# Patient Record
Sex: Female | Born: 1950 | Race: White | Hispanic: No | Marital: Married
Health system: Southern US, Community
[De-identification: ages and names within clinical notes are randomized; demographics above are authoritative.]

---

## 1970-08-23 HISTORY — PX: BREAST EXCISIONAL BIOPSY: SUR124

## 1998-01-02 ENCOUNTER — Other Ambulatory Visit: Admission: RE | Admit: 1998-01-02 | Discharge: 1998-01-02 | Payer: Self-pay | Admitting: Obstetrics & Gynecology

## 1999-01-22 ENCOUNTER — Other Ambulatory Visit: Admission: RE | Admit: 1999-01-22 | Discharge: 1999-01-22 | Payer: Self-pay | Admitting: Obstetrics & Gynecology

## 1999-09-15 ENCOUNTER — Other Ambulatory Visit: Admission: RE | Admit: 1999-09-15 | Discharge: 1999-09-15 | Payer: Self-pay | Admitting: Otolaryngology

## 1999-09-15 ENCOUNTER — Encounter (INDEPENDENT_AMBULATORY_CARE_PROVIDER_SITE_OTHER): Payer: Self-pay

## 2000-01-26 ENCOUNTER — Other Ambulatory Visit: Admission: RE | Admit: 2000-01-26 | Discharge: 2000-01-26 | Payer: Self-pay | Admitting: Obstetrics & Gynecology

## 2001-04-26 ENCOUNTER — Other Ambulatory Visit: Admission: RE | Admit: 2001-04-26 | Discharge: 2001-04-26 | Payer: Self-pay | Admitting: Obstetrics & Gynecology

## 2002-05-08 ENCOUNTER — Other Ambulatory Visit: Admission: RE | Admit: 2002-05-08 | Discharge: 2002-05-08 | Payer: Self-pay | Admitting: Obstetrics & Gynecology

## 2002-10-03 ENCOUNTER — Encounter: Admission: RE | Admit: 2002-10-03 | Discharge: 2002-10-03 | Payer: Self-pay | Admitting: Obstetrics & Gynecology

## 2002-10-03 ENCOUNTER — Encounter: Payer: Self-pay | Admitting: Obstetrics & Gynecology

## 2002-10-03 ENCOUNTER — Encounter (INDEPENDENT_AMBULATORY_CARE_PROVIDER_SITE_OTHER): Payer: Self-pay | Admitting: *Deleted

## 2002-11-01 ENCOUNTER — Ambulatory Visit (HOSPITAL_COMMUNITY): Admission: RE | Admit: 2002-11-01 | Discharge: 2002-11-01 | Payer: Self-pay | Admitting: General Surgery

## 2002-11-01 ENCOUNTER — Encounter (INDEPENDENT_AMBULATORY_CARE_PROVIDER_SITE_OTHER): Payer: Self-pay | Admitting: Specialist

## 2003-06-10 ENCOUNTER — Other Ambulatory Visit: Admission: RE | Admit: 2003-06-10 | Discharge: 2003-06-10 | Payer: Self-pay | Admitting: Obstetrics and Gynecology

## 2003-11-27 ENCOUNTER — Encounter: Admission: RE | Admit: 2003-11-27 | Discharge: 2004-01-23 | Payer: Self-pay | Admitting: Family Medicine

## 2003-12-12 ENCOUNTER — Encounter: Admission: RE | Admit: 2003-12-12 | Discharge: 2003-12-12 | Payer: Self-pay | Admitting: Obstetrics and Gynecology

## 2003-12-27 ENCOUNTER — Observation Stay (HOSPITAL_COMMUNITY): Admission: AD | Admit: 2003-12-27 | Discharge: 2003-12-28 | Payer: Self-pay | Admitting: Obstetrics and Gynecology

## 2003-12-27 ENCOUNTER — Encounter (INDEPENDENT_AMBULATORY_CARE_PROVIDER_SITE_OTHER): Payer: Self-pay | Admitting: Specialist

## 2004-06-04 ENCOUNTER — Encounter: Admission: RE | Admit: 2004-06-04 | Discharge: 2004-06-04 | Payer: Self-pay | Admitting: Obstetrics and Gynecology

## 2004-06-12 ENCOUNTER — Encounter (HOSPITAL_COMMUNITY): Admission: RE | Admit: 2004-06-12 | Discharge: 2004-09-10 | Payer: Self-pay | Admitting: Obstetrics and Gynecology

## 2004-06-15 ENCOUNTER — Encounter: Admission: RE | Admit: 2004-06-15 | Discharge: 2004-06-15 | Payer: Self-pay | Admitting: Obstetrics and Gynecology

## 2004-06-22 ENCOUNTER — Other Ambulatory Visit: Admission: RE | Admit: 2004-06-22 | Discharge: 2004-06-22 | Payer: Self-pay | Admitting: Obstetrics and Gynecology

## 2005-01-06 ENCOUNTER — Encounter: Admission: RE | Admit: 2005-01-06 | Discharge: 2005-01-06 | Payer: Self-pay | Admitting: General Surgery

## 2005-12-08 ENCOUNTER — Other Ambulatory Visit: Admission: RE | Admit: 2005-12-08 | Discharge: 2005-12-08 | Payer: Self-pay | Admitting: Obstetrics and Gynecology

## 2006-01-07 ENCOUNTER — Encounter: Admission: RE | Admit: 2006-01-07 | Discharge: 2006-01-07 | Payer: Self-pay | Admitting: General Surgery

## 2007-01-09 ENCOUNTER — Encounter: Admission: RE | Admit: 2007-01-09 | Discharge: 2007-01-09 | Payer: Self-pay | Admitting: General Surgery

## 2008-01-11 ENCOUNTER — Encounter: Admission: RE | Admit: 2008-01-11 | Discharge: 2008-01-11 | Payer: Self-pay | Admitting: Obstetrics and Gynecology

## 2008-01-22 ENCOUNTER — Encounter: Admission: RE | Admit: 2008-01-22 | Discharge: 2008-01-22 | Payer: Self-pay | Admitting: Obstetrics and Gynecology

## 2009-01-22 ENCOUNTER — Encounter: Admission: RE | Admit: 2009-01-22 | Discharge: 2009-01-22 | Payer: Self-pay | Admitting: Obstetrics and Gynecology

## 2010-02-04 ENCOUNTER — Encounter: Admission: RE | Admit: 2010-02-04 | Discharge: 2010-02-04 | Payer: Self-pay | Admitting: Obstetrics and Gynecology

## 2010-09-13 ENCOUNTER — Encounter: Payer: Self-pay | Admitting: Obstetrics and Gynecology

## 2010-10-15 ENCOUNTER — Other Ambulatory Visit: Payer: Self-pay | Admitting: Dermatology

## 2011-01-08 NOTE — Discharge Summary (Signed)
Kathryn Whitney, Kathryn Whitney                          ACCOUNT NO.:  [id number]   MEDICAL RECORD NO.:  [id number]                   PATIENT TYPE:  OBV   LOCATION:  9304                                 FACILITY:  [facility]   PHYSICIAN:  Guy Sandifer. Arleta Creek, M.D.           DATE OF BIRTH:  25-Apr-1951   DATE OF ADMISSION:  12/27/2003  DATE OF DISCHARGE:  12/28/2003                                 DISCHARGE SUMMARY   ADMISSION DIAGNOSIS:  Postmenopausal bleeding.   DISCHARGE DIAGNOSIS:  Postmenopausal bleeding.   PROCEDURE:  On Dec 26, 2003 hysteroscopy, dilation and curettage, diagnostic  laparoscopy.   REASON FOR ADMISSION:  This patient is a 60 year old white married female  who is admitted for the above procedures to investigate postmenopausal  bleeding.  During the course of the surgery, a uterine perforation was  encountered.  At laparoscopy no damage to surrounding structures was noted  and hemostasis was easily obtained with a simple bipolar cautery on the  uterine fundus at the point of perforation.  She is observed for observation  overnight.   HOSPITAL COURSE:  The patient is observed overnight.  Saline lock is  maintained and she received Ancef every 6 hours.  Vital signs remained  stable and she is afebrile.  On the morning of discharge, she is tolerating  a regular diet, ambulating, voiding normally, and feeling fine.  White count  is 11.2, hemoglobin 11.2.  Lungs are clear, abdomen is soft and nontender  with normal bowel sounds in all four quadrants.  The incisions are healing  well.  She is having no vaginal bleeding.   CONDITION ON DISCHARGE:  Good.   DIET:  Regular as tolerated.   ACTIVITY:  No heavy lifting, no vaginal entry.  She is to call the office  for problems including but not limited to heavy vaginal bleeding,  temperature of 101 degrees, persistent nausea and vomiting, increasing pain.   DISCHARGE MEDICATIONS:  Darvocet-N 100 one to two p.o. q.6 h. p.r.n.   FOLLOW  UP:  In the office in one to two weeks.                                               Guy Sandifer Arleta Creek, M.D.    JET/MEDQ  D:  12/28/2003  T:  12/28/2003  Job:  [id number]

## 2011-01-08 NOTE — Op Note (Signed)
Kathryn Whitney, Kathryn Whitney                          ACCOUNT NO.:  [id number]   MEDICAL RECORD NO.:  [id number]                   PATIENT TYPE:  OBV   LOCATION:  9304                                 FACILITY:  [facility]   PHYSICIAN:  Guy Sandifer. Arleta Creek, M.D.           DATE OF BIRTH:  Jan 16, 1951   DATE OF PROCEDURE:  12/27/2003  DATE OF DISCHARGE:                                 OPERATIVE REPORT   PREOPERATIVE DIAGNOSIS:  Postmenopausal bleeding.   POSTOPERATIVE DIAGNOSIS:  Postmenopausal bleeding.   PROCEDURES:  1. Hysteroscopy with dilatation and curettage.  2. Diagnostic laparoscopy.   SURGEON:  Guy Sandifer. Henderson Cloud, M.D.   ANESTHESIA:  1. General with LMA, Raul Del, M.D.  2. 1% Xylocaine paracervical block.   ESTIMATED BLOOD LOSS:  Less than 30 mL.   SPECIMENS:  Endometrial curettings.   COMPLICATIONS:  Uterine perforation.   INDICATIONS AND CONSENT:  This patient is a 60 year old married white female  with postmenopausal bleeding.  Details are dictated in the history and  physical.  Hysteroscopy, dilatation and curettage has been discussed with  the patient.  The potential risks and complications were discussed  preoperatively, including but not limited to infection, bowel, bladder, or  ureteral damage, bleeding requiring transfusion of blood products with  possible transfusion reaction, HIV and hepatitis acquisition, DVT, PE,  pneumonia, laparoscopy, laparotomy, hysterectomy, recurrent postmenopausal  bleeding.  All questions have been answered and consent is signed on the  chart.   FINDINGS:  The endometrial canal is small in caliber.  It is atrophic.  There is approximately a 0.5 cm structure on the posterior wall consistent  with a small submucous fibroid.   PROCEDURE:  The patient is taken to the operating room, where she is  identified, placed in the dorsal lithotomy position while awake to assure  proper placement of her legs in view of her history of back pain.   She then  has general anesthesia via LMA.  She is prepped vaginally, straight-  catheterized, and draped in a sterile fashion.  The speculum was placed, a  bivalve speculum is placed in the vagina.  The anterior cervical lip is  injected with 1% Xylocaine and grasped with a single-tooth tenaculum.  A  paracervical block is then placed in the 2, 4, 5, 7, 8, and 10 o'clock  positions with approximately 20 mL total of 1% Xylocaine.  The cervix is  gently progressively dilated to a 23 Pratt dilator.  Diagnostic hysteroscope  is then placed in the endocervical canal and advanced under direct  visualization using sorbitol distending medium.  The above findings are  noted.  The hysteroscope is withdrawn.  The small curette is then passed and  a single pass is made over the lower posterior wall of the uterine cavity in  an effort to get the above-described submucous fibroid.  The curette is then  very gently passed with only two fingers  grasping the curette in an effort  to probe the top of the uterine cavity.  There is absolutely no resistance  noted on the curette and it passes to a point that is obviously consistent  with perforation.  The curette is then immediately gently withdrawn.  A  uterine sound is then very gently passed and again passes to a depth of  approximately 12 cm consistent with perforation.  At this point the  hysteroscopy is terminated.  A scant amount of tissue that was obtained with  that initial pass of the curette is sent to pathology.  There is no bleeding  noted from the cervix upon several minutes of observation.  The tenaculum is  removed from the cervix, the speculum is removed.  The patient is then  reprepped and draped in a sterile fashion.  A small infraumbilical incision  is made and a disposable Veress need is placed in the peritoneal cavity with  a normal syringe and drop test.  Two liters of gas are insufflated under low  pressure with good tympany in the right  upper quadrant.  The Veress needle  is removed and replaced with a 10/11 disposable trocar sleeve.  Placement is  verified with the laparoscope and no damage to surrounding structures is  noted.  Pneumoperitoneum is induced.  A small suprapubic incision is made  and a 5 mm nondisposable trocar sleeve is placed under direct visualization  without difficulty.  The uterus is gently elevated with a blunt probe.  The  uterus itself is rather small.  There is a single perforation on the right  fundus.  Careful inspection reveals no evidence to surrounding structures,  including the bowel.  Copious irrigation is carried out.  Hemostasis is  attained with bipolar cautery.  The area is then observed for five or 10  minutes.  It is also observed under reduced pneumoperitoneum and excellent  hemostasis is noted.  The excess fluid is removed.  Surgicel is then back-  loaded through the laparoscope and placed on the uterine fundus as well.  The suprapubic trocar sleeve is removed, good hemostasis is noted, and the  pneumoperitoneum is completely reduced.  The umbilical trocar sleeve is  removed and the umbilical incision is closed with a 2-0 Vicryl suture in the  subcutaneous layers, with care being taken not to pick up any underlying  structures.  Both incisions are then injected with 0.5% plain Marcaine and  Dermabond is applied to both.  All counts correct.  The patient is awakened  and taken to the recovery room in stable condition.  She will be observed  overnight in the hospital.                                               Guy Sandifer. Arleta Creek, M.D.    JET/MEDQ  D:  12/27/2003  T:  12/28/2003  Job:  [id number]

## 2011-01-08 NOTE — Op Note (Signed)
   Kathryn Whitney, LUETH                          ACCOUNT NO.:  [id number]   MEDICAL RECORD NO.:  [id number]                   PATIENT TYPE:  AMB   LOCATION:  DAY                                  FACILITY:  [facility]   PHYSICIAN:  Timothy E. Earlene Plater, M.D.              DATE OF BIRTH:  [birth year]   DATE OF PROCEDURE:  11/01/2002  DATE OF DISCHARGE:                                 OPERATIVE REPORT   PREOPERATIVE DIAGNOSIS:  Mass, right breast.   POSTOPERATIVE DIAGNOSIS:  Mass, right breast.   PROCEDURE:  Excision and biopsy, mass, right breast.   SURGEON:  Timothy E. Earlene Plater, M.D.   ANESTHESIA:  Local standby.   INDICATIONS:  Ms. Tonnesen is 18, otherwise healthy.  A mass in the right  breast was detected.  Ultrasound-guided biopsy was equivocal and because of  the presence of the mass and the path report, the patient is very anxious to  have a complete excisional biopsy, and I quite agree.  She was identified.  Here CBC, chemistry profile, and EKG were normal.  Permit was signed for the  correct right breast biopsy.   DESCRIPTION OF PROCEDURE:  She was taken to the operating room, placed  supine, IV started, sedation given.  The right breast was prepped and draped  in the usual fashion.  Marcaine 25% was used for local anesthesia.  The mass  was located between the 11 and 12 o'clock position of the right breast,  approximately 3 cm outside the areolar edge.  This area was easily palpable.  It was marked.  The area was anesthetized.  A curvilinear 5 cm incision was  made over the mass, the subcutaneous fatty tissue dissected, the mass  identified, grasped, and sharply divided from the surrounding tissue.  There  were several areas of chronic cystic change, and these were included in the  biopsy.  The entire mass was removed and marked for pathology and sent fresh  for their evaluation.  Meanwhile, bleeding  points were cauterized; the wound was dry.  The deep subcu was approximated  with  3-0 Vicryl, and the skin was closed with 3-0 Monocryl.  Counts correct.  She tolerated it well.  Steri-Strips and dry sterile dressing applied.  She  will be followed in the office.  Percocet and instructions given.                                               Timothy E. Earlene Plater, M.D.    TED/MEDQ  D:  11/01/2002  T:  11/01/2002  Job:  [id number]   cc:   [facility]  [place], [place]   Gerrit Friends. Aldona Bar, M.D.  [address]  [place]  [place] [address]  Fax: [phone]

## 2011-01-08 NOTE — H&P (Signed)
Kathryn Whitney, Kathryn Whitney                          ACCOUNT NO.:  [id number]   MEDICAL RECORD NO.:  [id number]                   PATIENT TYPE:  AMB   LOCATION:  SDC                                  [facility]ACILITY:  [facility]   PHYSICIAN:  Guy Sandifer. Arleta Creek, M.D.           DATE OF BIRTH:  12/21/1950   DATE OF ADMISSION:  12/27/2003  DATE OF DISCHARGE:                                HISTORY & PHYSICAL   CHIEF COMPLAINT:  Postmenopausal bleeding.   HISTORY OF PRESENT ILLNESS:  This patient is a 60 year old white female who  has been postmenopausal for the past three to four years.  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  She has  been off of estrogens for at least several months.  She recently had some  vaginal spotting of bright red blood.  Pelvic ultrasound on December 16, 2003,  was consistent with the uterus measuring 5.6 x 1.9 x 2.8 cm.  The ovaries  were normal.  Sonohysterogram was suspicious for a 6 mm mass on the  posterior wall of the endometrial cavity possibly a polyp.  The patient is  being admitted for hysteroscopy and D&C.  Potential risks and complications  have been discussed preoperatively.   PAST MEDICAL HISTORY:  1. 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  2. Back pain with recent right leg numbness that comes and goes.  3. History of melanoma.  4. History of kidney stones.   PAST SURGICAL HISTORY:  Breast biopsies x2.   PAST OBSTETRICAL HISTORY:  Vaginal delivery x1.   MEDICATIONS:  1. Naproxen p.r.n.  2. Black Cohosh p.r.n.  3. Evista daily.  4. Lipitor daily.   ALLERGIES:  CODEINE leading to nausea.   FAMILY HISTORY:  Type 2 diabetes in the mother, heart disease in mother,  kidney disease in mother, stroke in grandmother.   SOCIAL HISTORY:  Alcohol on a social basis.  Denies tobacco or drug abuse.   REVIEW OF SYSTEMS:  NEUROMUSCULAR:  History of backache as above, recently  on prednisone Dosepak in the last  month.  PULMONARY:  Denies shortness of  breath.  CARDIO:  Denies chest pain.   PHYSICAL EXAMINATION:  VITAL SIGNS:  Height 5 feet 1 inch, weight 147  pounds, blood pressure 116/76.  HEENT:  Without thyromegaly.  LUNGS:  Clear to auscultation.  HEART:  Regular rate and rhythm.  BACK:  Without CVA tenderness.  BREASTS:  Without mass, retraction, or discharge.  ABDOMEN:  Soft and nontender without masses.  PELVIC:  Vulva, vagina, and cervix without lesions.  Uterus normal size,  mobile, and nontender. Adnexa nontender without masses.  EXTREMITIES:  NEUROLOGY:  Grossly within normal limits.   ASSESSMENT:  Postmenopausal bleeding.   PLAN:  Hysteroscopy and D&C.  Guy Sandifer Arleta Creek, M.D.    JET/MEDQ  D:  12/27/2003  T:  12/27/2003  Job:  [id number]

## 2012-04-11 ENCOUNTER — Other Ambulatory Visit: Payer: Self-pay | Admitting: Dermatology

## 2012-10-12 ENCOUNTER — Other Ambulatory Visit: Payer: Self-pay | Admitting: Dermatology

## 2013-04-24 ENCOUNTER — Other Ambulatory Visit: Payer: Self-pay

## 2013-04-24 DIAGNOSIS — Z1231 Encounter for screening mammogram for malignant neoplasm of breast: Secondary | ICD-10-CM

## 2013-04-26 ENCOUNTER — Ambulatory Visit
Admission: RE | Admit: 2013-04-26 | Discharge: 2013-04-26 | Disposition: A | Discharge status: Home / Self Care | Payer: BC Managed Care – PPO | Source: Ambulatory Visit

## 2013-04-26 DIAGNOSIS — Z1231 Encounter for screening mammogram for malignant neoplasm of breast: Secondary | ICD-10-CM

## 2013-10-25 ENCOUNTER — Other Ambulatory Visit: Payer: Self-pay | Admitting: Dermatology

## 2014-04-05 ENCOUNTER — Other Ambulatory Visit: Payer: Self-pay

## 2014-04-05 DIAGNOSIS — Z1231 Encounter for screening mammogram for malignant neoplasm of breast: Secondary | ICD-10-CM

## 2014-04-30 ENCOUNTER — Ambulatory Visit: Payer: BC Managed Care – PPO

## 2014-05-09 ENCOUNTER — Other Ambulatory Visit: Payer: Self-pay | Admitting: Obstetrics and Gynecology

## 2014-05-13 LAB — CYTOLOGY - PAP

## 2014-05-15 ENCOUNTER — Encounter (INDEPENDENT_AMBULATORY_CARE_PROVIDER_SITE_OTHER): Payer: Self-pay

## 2014-05-15 ENCOUNTER — Ambulatory Visit
Admission: RE | Admit: 2014-05-15 | Discharge: 2014-05-15 | Disposition: A | Discharge status: Home / Self Care | Payer: BC Managed Care – PPO | Source: Ambulatory Visit

## 2014-05-15 DIAGNOSIS — Z1231 Encounter for screening mammogram for malignant neoplasm of breast: Secondary | ICD-10-CM

## 2014-07-16 ENCOUNTER — Other Ambulatory Visit: Payer: Self-pay | Admitting: Gastroenterology

## 2014-07-16 DIAGNOSIS — K639 Disease of intestine, unspecified: Secondary | ICD-10-CM

## 2014-07-22 ENCOUNTER — Ambulatory Visit
Admission: RE | Admit: 2014-07-22 | Discharge: 2014-07-22 | Disposition: A | Discharge status: Home / Self Care | Payer: BC Managed Care – PPO | Source: Ambulatory Visit | Attending: Gastroenterology | Admitting: Gastroenterology

## 2014-07-22 DIAGNOSIS — K639 Disease of intestine, unspecified: Secondary | ICD-10-CM

## 2014-07-22 MED ORDER — IOHEXOL 300 MG/ML  SOLN
100.0000 mL | Freq: Once | INTRAMUSCULAR | Status: AC | PRN
  Administered 2014-07-22: 100 mL via INTRAVENOUS

## 2015-05-13 ENCOUNTER — Other Ambulatory Visit: Payer: Self-pay

## 2015-05-13 DIAGNOSIS — Z1231 Encounter for screening mammogram for malignant neoplasm of breast: Secondary | ICD-10-CM

## 2015-06-10 ENCOUNTER — Ambulatory Visit
Admission: RE | Admit: 2015-06-10 | Discharge: 2015-06-10 | Disposition: A | Discharge status: Home / Self Care | Payer: BLUE CROSS/BLUE SHIELD | Source: Ambulatory Visit

## 2015-06-10 DIAGNOSIS — Z1231 Encounter for screening mammogram for malignant neoplasm of breast: Secondary | ICD-10-CM

## 2015-06-13 ENCOUNTER — Other Ambulatory Visit: Payer: Self-pay | Admitting: Obstetrics and Gynecology

## 2015-06-13 DIAGNOSIS — R928 Other abnormal and inconclusive findings on diagnostic imaging of breast: Secondary | ICD-10-CM

## 2015-06-19 ENCOUNTER — Ambulatory Visit
Admission: RE | Admit: 2015-06-19 | Discharge: 2015-06-19 | Disposition: A | Discharge status: Home / Self Care | Payer: BLUE CROSS/BLUE SHIELD | Source: Ambulatory Visit | Attending: Obstetrics and Gynecology | Admitting: Obstetrics and Gynecology

## 2015-06-19 DIAGNOSIS — R928 Other abnormal and inconclusive findings on diagnostic imaging of breast: Secondary | ICD-10-CM

## 2016-03-31 ENCOUNTER — Other Ambulatory Visit: Payer: Self-pay | Admitting: Obstetrics and Gynecology

## 2016-03-31 DIAGNOSIS — Z1231 Encounter for screening mammogram for malignant neoplasm of breast: Secondary | ICD-10-CM

## 2016-06-14 ENCOUNTER — Ambulatory Visit
Admission: RE | Admit: 2016-06-14 | Discharge: 2016-06-14 | Disposition: A | Discharge status: Home / Self Care | Payer: BLUE CROSS/BLUE SHIELD | Source: Ambulatory Visit | Attending: Obstetrics and Gynecology | Admitting: Obstetrics and Gynecology

## 2016-06-14 DIAGNOSIS — Z1231 Encounter for screening mammogram for malignant neoplasm of breast: Secondary | ICD-10-CM

## 2017-05-16 ENCOUNTER — Other Ambulatory Visit: Payer: Self-pay | Admitting: Obstetrics and Gynecology

## 2017-05-16 DIAGNOSIS — Z1231 Encounter for screening mammogram for malignant neoplasm of breast: Secondary | ICD-10-CM

## 2017-06-16 ENCOUNTER — Ambulatory Visit
Admission: RE | Admit: 2017-06-16 | Discharge: 2017-06-16 | Disposition: A | Discharge status: Home / Self Care | Payer: BLUE CROSS/BLUE SHIELD | Source: Ambulatory Visit | Attending: Obstetrics and Gynecology | Admitting: Obstetrics and Gynecology

## 2017-06-16 DIAGNOSIS — Z1231 Encounter for screening mammogram for malignant neoplasm of breast: Secondary | ICD-10-CM

## 2018-06-01 DIAGNOSIS — D225 Melanocytic nevi of trunk: Secondary | ICD-10-CM | POA: Diagnosis not present

## 2018-06-01 DIAGNOSIS — D2262 Melanocytic nevi of left upper limb, including shoulder: Secondary | ICD-10-CM | POA: Diagnosis not present

## 2018-06-01 DIAGNOSIS — Z85820 Personal history of malignant melanoma of skin: Secondary | ICD-10-CM | POA: Diagnosis not present

## 2018-06-01 DIAGNOSIS — L814 Other melanin hyperpigmentation: Secondary | ICD-10-CM | POA: Diagnosis not present

## 2018-06-01 DIAGNOSIS — L821 Other seborrheic keratosis: Secondary | ICD-10-CM | POA: Diagnosis not present

## 2018-06-01 DIAGNOSIS — L918 Other hypertrophic disorders of the skin: Secondary | ICD-10-CM | POA: Diagnosis not present

## 2018-06-01 DIAGNOSIS — L738 Other specified follicular disorders: Secondary | ICD-10-CM | POA: Diagnosis not present

## 2018-06-01 DIAGNOSIS — D1721 Benign lipomatous neoplasm of skin and subcutaneous tissue of right arm: Secondary | ICD-10-CM | POA: Diagnosis not present

## 2018-06-01 DIAGNOSIS — D1801 Hemangioma of skin and subcutaneous tissue: Secondary | ICD-10-CM | POA: Diagnosis not present

## 2018-06-01 DIAGNOSIS — Z85828 Personal history of other malignant neoplasm of skin: Secondary | ICD-10-CM | POA: Diagnosis not present

## 2018-06-26 DIAGNOSIS — R69 Illness, unspecified: Secondary | ICD-10-CM | POA: Diagnosis not present

## 2018-09-06 DIAGNOSIS — E7800 Pure hypercholesterolemia, unspecified: Secondary | ICD-10-CM | POA: Diagnosis not present

## 2018-09-06 DIAGNOSIS — G4700 Insomnia, unspecified: Secondary | ICD-10-CM | POA: Diagnosis not present

## 2018-09-06 DIAGNOSIS — Z79899 Other long term (current) drug therapy: Secondary | ICD-10-CM | POA: Diagnosis not present

## 2018-09-06 DIAGNOSIS — E663 Overweight: Secondary | ICD-10-CM | POA: Diagnosis not present

## 2018-09-06 DIAGNOSIS — Z6829 Body mass index (BMI) 29.0-29.9, adult: Secondary | ICD-10-CM | POA: Diagnosis not present

## 2018-09-06 DIAGNOSIS — R7303 Prediabetes: Secondary | ICD-10-CM | POA: Diagnosis not present

## 2018-10-18 DIAGNOSIS — H2513 Age-related nuclear cataract, bilateral: Secondary | ICD-10-CM | POA: Diagnosis not present

## 2018-10-18 DIAGNOSIS — H5203 Hypermetropia, bilateral: Secondary | ICD-10-CM | POA: Diagnosis not present

## 2018-10-18 DIAGNOSIS — H524 Presbyopia: Secondary | ICD-10-CM | POA: Diagnosis not present

## 2018-10-18 DIAGNOSIS — H04123 Dry eye syndrome of bilateral lacrimal glands: Secondary | ICD-10-CM | POA: Diagnosis not present

## 2018-10-18 DIAGNOSIS — H52223 Regular astigmatism, bilateral: Secondary | ICD-10-CM | POA: Diagnosis not present

## 2019-07-06 DIAGNOSIS — R69 Illness, unspecified: Secondary | ICD-10-CM | POA: Diagnosis not present

## 2019-09-11 ENCOUNTER — Ambulatory Visit: Payer: Medicare HMO | Attending: Internal Medicine

## 2019-09-11 DIAGNOSIS — Z23 Encounter for immunization: Secondary | ICD-10-CM

## 2019-09-11 NOTE — Progress Notes (Signed)
   Covid-19 Vaccination Clinic  Name:  Kathryn Whitney    MRN: [id number] DOB: Dec 18, 1950  09/11/2019  Ms. Kathryn Whitney was observed post Covid-19 immunization for 15 minutes without incidence. She was provided with Vaccine Information Sheet and instruction to access the V-Safe system.   Ms. Kathryn Whitney was instructed to call 911 with any severe reactions post vaccine: Marland Kitchen Difficulty breathing  . Swelling of your face and throat  . A fast heartbeat  . A bad rash all over your body  . Dizziness and weakness    Immunizations Administered    Name Date Dose VIS Date Route   Pfizer COVID-19 Vaccine 09/11/2019 11:00 AM 0.3 mL 08/03/2019 Intramuscular   Manufacturer: [employer], [employer]   Lot: [id number]   [place]: [id number]

## 2019-10-01 ENCOUNTER — Ambulatory Visit: Payer: Medicare HMO | Attending: Internal Medicine

## 2019-10-01 DIAGNOSIS — Z23 Encounter for immunization: Secondary | ICD-10-CM | POA: Insufficient documentation

## 2019-10-01 NOTE — Progress Notes (Signed)
   Covid-19 Vaccination Clinic  Name:  SUSEJ SPECKER    MRN: [id number] DOB: 07-09-1951  10/01/2019  Ms. Grieshop was observed post Covid-19 immunization for 15 minutes without incidence. She was provided with Vaccine Information Sheet and instruction to access the V-Safe system.   Ms. Glanzer was instructed to call 911 with any severe reactions post vaccine: Marland Kitchen Difficulty breathing  . Swelling of your face and throat  . A fast heartbeat  . A bad rash all over your body  . Dizziness and weakness    Immunizations Administered    Name Date Dose VIS Date Route   Pfizer COVID-19 Vaccine 10/01/2019  9:28 AM 0.3 mL 08/03/2019 Intramuscular   Manufacturer: [place]   Lot: [id number]   [place]: [id number]

## 2019-11-27 ENCOUNTER — Other Ambulatory Visit: Payer: Self-pay | Admitting: Family Medicine

## 2019-11-27 DIAGNOSIS — Z1231 Encounter for screening mammogram for malignant neoplasm of breast: Secondary | ICD-10-CM

## 2019-12-14 ENCOUNTER — Ambulatory Visit
Admission: RE | Admit: 2019-12-14 | Discharge: 2019-12-14 | Disposition: A | Discharge status: Home / Self Care | Payer: Medicare HMO | Source: Ambulatory Visit | Attending: Family Medicine | Admitting: Family Medicine

## 2019-12-14 ENCOUNTER — Other Ambulatory Visit: Payer: Self-pay

## 2019-12-14 DIAGNOSIS — Z1231 Encounter for screening mammogram for malignant neoplasm of breast: Secondary | ICD-10-CM

## 2019-12-17 ENCOUNTER — Other Ambulatory Visit: Payer: Self-pay | Admitting: Family Medicine

## 2019-12-17 DIAGNOSIS — R928 Other abnormal and inconclusive findings on diagnostic imaging of breast: Secondary | ICD-10-CM

## 2019-12-24 ENCOUNTER — Ambulatory Visit
Admission: RE | Admit: 2019-12-24 | Discharge: 2019-12-24 | Disposition: A | Discharge status: Home / Self Care | Payer: Medicare HMO | Source: Ambulatory Visit | Attending: Family Medicine | Admitting: Family Medicine

## 2019-12-24 ENCOUNTER — Other Ambulatory Visit: Payer: Self-pay

## 2019-12-24 DIAGNOSIS — R928 Other abnormal and inconclusive findings on diagnostic imaging of breast: Secondary | ICD-10-CM

## 2019-12-24 DIAGNOSIS — N6011 Diffuse cystic mastopathy of right breast: Secondary | ICD-10-CM | POA: Diagnosis not present

## 2020-01-02 DIAGNOSIS — D1801 Hemangioma of skin and subcutaneous tissue: Secondary | ICD-10-CM | POA: Diagnosis not present

## 2020-01-02 DIAGNOSIS — L821 Other seborrheic keratosis: Secondary | ICD-10-CM | POA: Diagnosis not present

## 2020-01-02 DIAGNOSIS — D225 Melanocytic nevi of trunk: Secondary | ICD-10-CM | POA: Diagnosis not present

## 2020-01-02 DIAGNOSIS — Z85828 Personal history of other malignant neoplasm of skin: Secondary | ICD-10-CM | POA: Diagnosis not present

## 2020-01-02 DIAGNOSIS — Z85820 Personal history of malignant melanoma of skin: Secondary | ICD-10-CM | POA: Diagnosis not present

## 2020-01-02 DIAGNOSIS — D2262 Melanocytic nevi of left upper limb, including shoulder: Secondary | ICD-10-CM | POA: Diagnosis not present

## 2020-01-02 DIAGNOSIS — L918 Other hypertrophic disorders of the skin: Secondary | ICD-10-CM | POA: Diagnosis not present

## 2020-03-17 DIAGNOSIS — Z6830 Body mass index (BMI) 30.0-30.9, adult: Secondary | ICD-10-CM | POA: Diagnosis not present

## 2020-03-17 DIAGNOSIS — E669 Obesity, unspecified: Secondary | ICD-10-CM | POA: Diagnosis not present

## 2020-03-17 DIAGNOSIS — R7303 Prediabetes: Secondary | ICD-10-CM | POA: Diagnosis not present

## 2020-03-17 DIAGNOSIS — E7800 Pure hypercholesterolemia, unspecified: Secondary | ICD-10-CM | POA: Diagnosis not present

## 2020-03-17 DIAGNOSIS — Z86010 Personal history of colonic polyps: Secondary | ICD-10-CM | POA: Diagnosis not present

## 2020-03-17 DIAGNOSIS — Z79899 Other long term (current) drug therapy: Secondary | ICD-10-CM | POA: Diagnosis not present

## 2020-08-07 DIAGNOSIS — Z20822 Contact with and (suspected) exposure to covid-19: Secondary | ICD-10-CM | POA: Diagnosis not present

## 2021-06-02 IMAGING — MG DIGITAL SCREENING BILAT W/ TOMO W/ CAD
6 of 10 series · 6 of 30 positions shown · non-contrast
Comparison: Previous exam(s).

CLINICAL DATA: Screening.

EXAM:
DIGITAL SCREENING BILATERAL MAMMOGRAM WITH TOMO AND CAD

[R CC synth-2D]
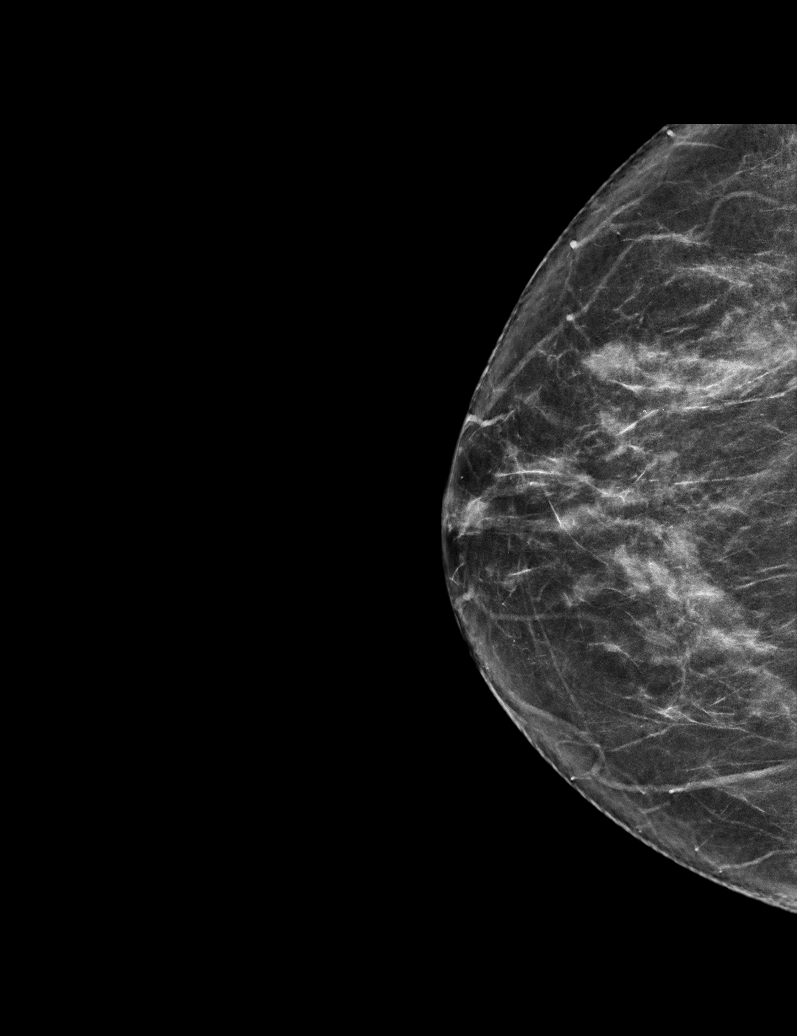

[L CC synth-2D (1 of 2)]
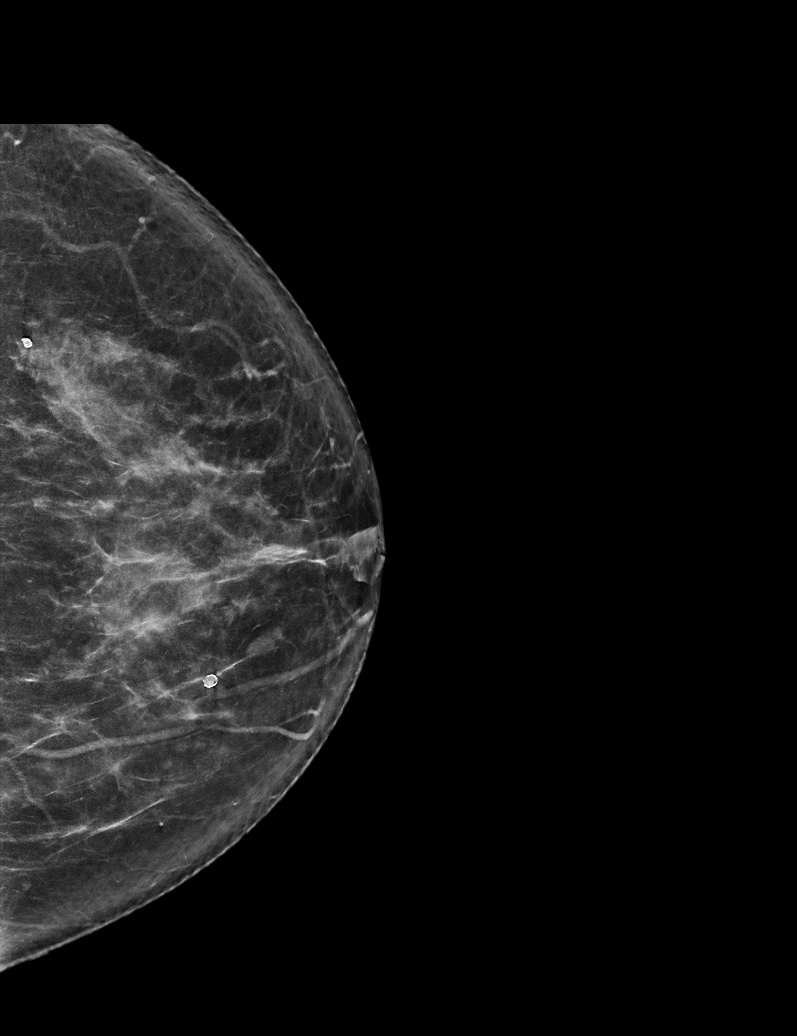

[L MLO synth-2D]
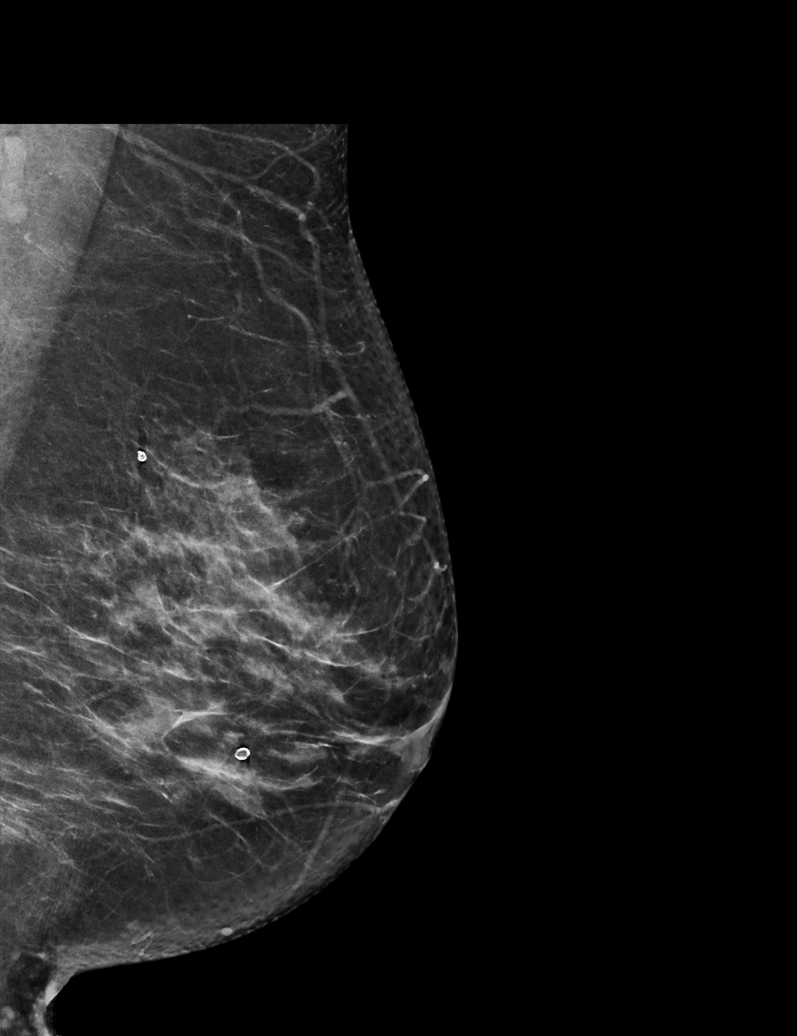

[R MLO synth-2D]
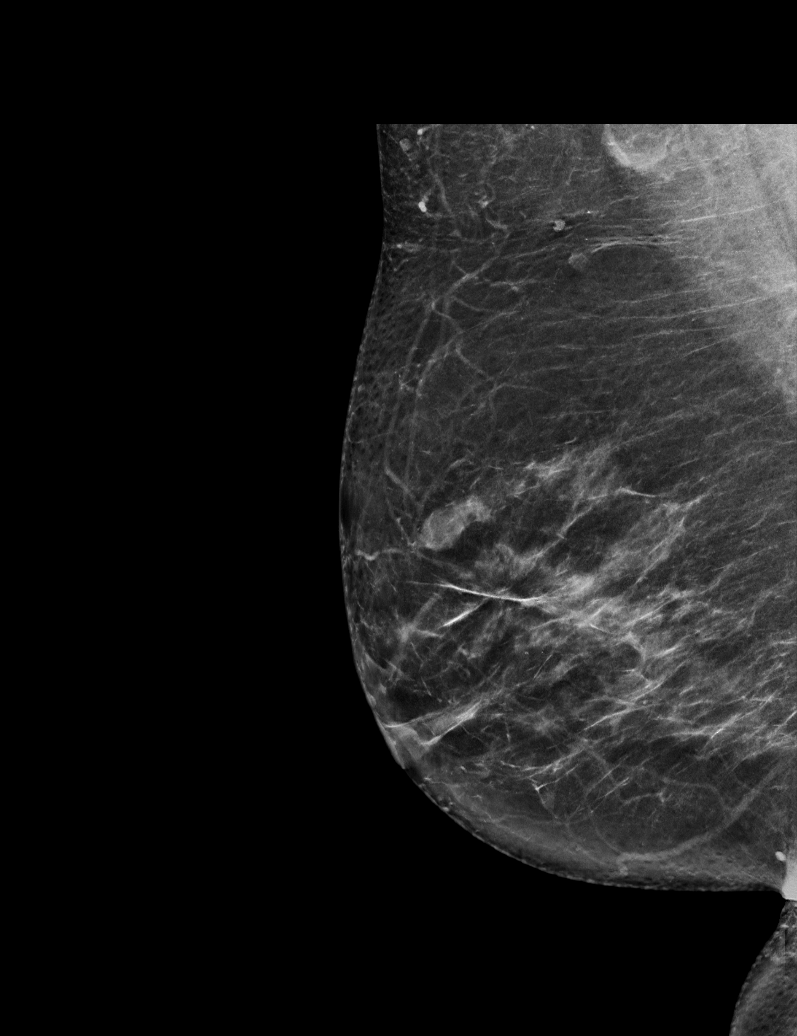

[L CC synth-2D (2 of 2)]
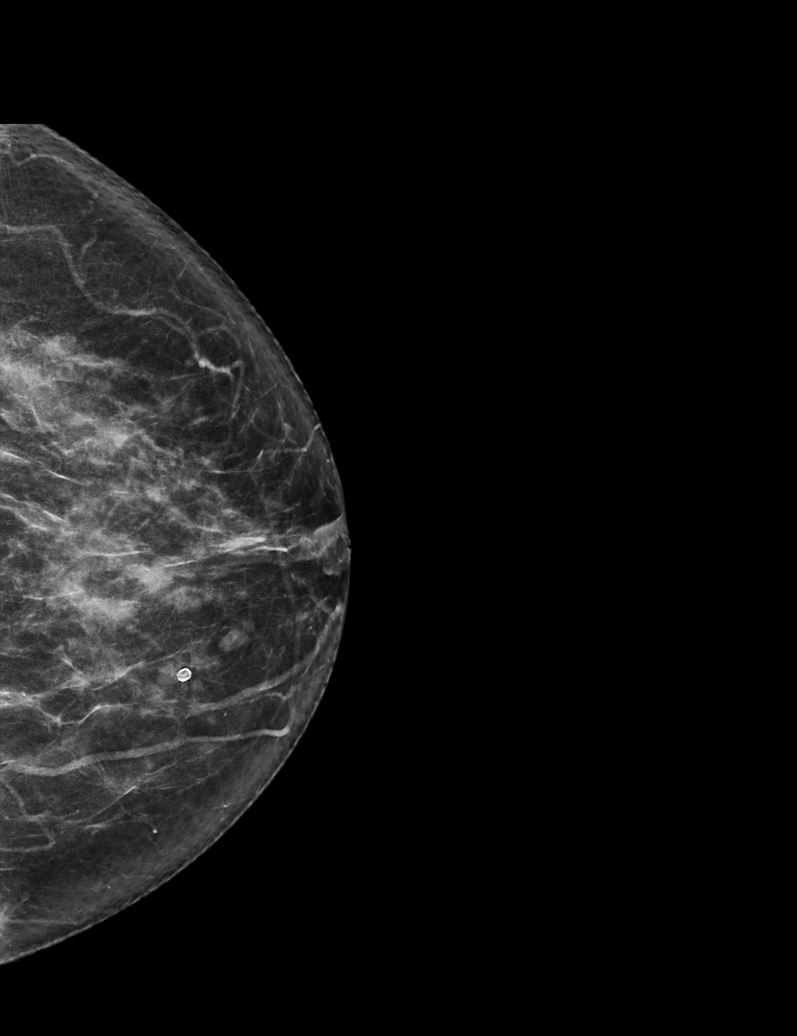

[R MLO tomo · tomo slice 37/73.0]
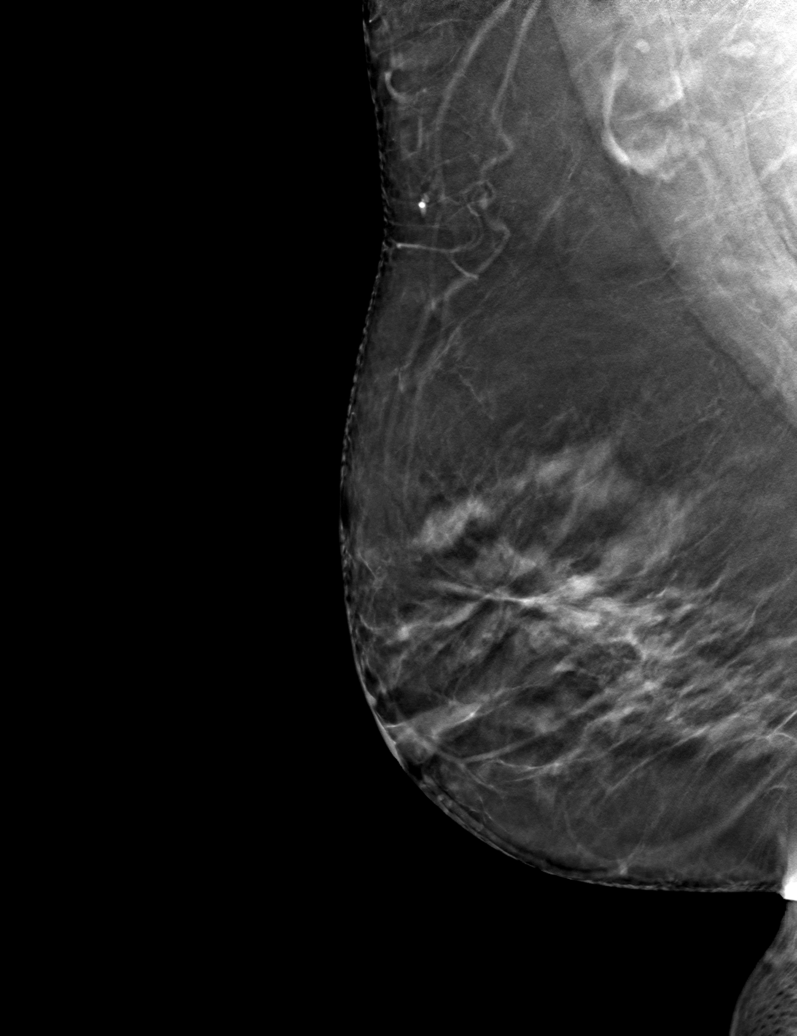

[6 of 30 positions shown; findings below may reference images not displayed]

ACR Breast Density Category c: The breast tissue is heterogeneously
dense, which may obscure small masses.
FINDINGS: In the right breast, a possible mass warrants further evaluation. In
the left breast, no findings suspicious for malignancy. Images were
processed with CAD.
IMPRESSION: Further evaluation is suggested for possible mass in the right
breast.

RECOMMENDATION:
Ultrasound of the right breast. (Code:8U-2-VVU)

The patient will be contacted regarding the findings, and additional
imaging will be scheduled.

BI-RADS CATEGORY  0: Incomplete. Need additional imaging evaluation
and/or prior mammograms for comparison.

## 2021-11-19 DIAGNOSIS — L821 Other seborrheic keratosis: Secondary | ICD-10-CM | POA: Diagnosis not present

## 2021-11-19 DIAGNOSIS — L918 Other hypertrophic disorders of the skin: Secondary | ICD-10-CM | POA: Diagnosis not present

## 2021-11-19 DIAGNOSIS — D2239 Melanocytic nevi of other parts of face: Secondary | ICD-10-CM | POA: Diagnosis not present

## 2021-11-19 DIAGNOSIS — L905 Scar conditions and fibrosis of skin: Secondary | ICD-10-CM | POA: Diagnosis not present

## 2021-11-19 DIAGNOSIS — Z85820 Personal history of malignant melanoma of skin: Secondary | ICD-10-CM | POA: Diagnosis not present

## 2021-11-19 DIAGNOSIS — L814 Other melanin hyperpigmentation: Secondary | ICD-10-CM | POA: Diagnosis not present

## 2021-11-19 DIAGNOSIS — L853 Xerosis cutis: Secondary | ICD-10-CM | POA: Diagnosis not present

## 2021-11-19 DIAGNOSIS — D1801 Hemangioma of skin and subcutaneous tissue: Secondary | ICD-10-CM | POA: Diagnosis not present

## 2021-11-19 DIAGNOSIS — D2261 Melanocytic nevi of right upper limb, including shoulder: Secondary | ICD-10-CM | POA: Diagnosis not present

## 2021-11-19 DIAGNOSIS — Z85828 Personal history of other malignant neoplasm of skin: Secondary | ICD-10-CM | POA: Diagnosis not present

## 2022-08-10 DIAGNOSIS — R3915 Urgency of urination: Secondary | ICD-10-CM | POA: Diagnosis not present

## 2022-08-10 DIAGNOSIS — R7303 Prediabetes: Secondary | ICD-10-CM | POA: Diagnosis not present

## 2022-08-10 DIAGNOSIS — Z1239 Encounter for other screening for malignant neoplasm of breast: Secondary | ICD-10-CM | POA: Diagnosis not present

## 2022-08-10 DIAGNOSIS — Z1211 Encounter for screening for malignant neoplasm of colon: Secondary | ICD-10-CM | POA: Diagnosis not present

## 2022-08-10 DIAGNOSIS — E669 Obesity, unspecified: Secondary | ICD-10-CM | POA: Diagnosis not present

## 2022-08-10 DIAGNOSIS — Z23 Encounter for immunization: Secondary | ICD-10-CM | POA: Diagnosis not present

## 2022-08-10 DIAGNOSIS — R002 Palpitations: Secondary | ICD-10-CM | POA: Diagnosis not present

## 2022-08-10 DIAGNOSIS — Z780 Asymptomatic menopausal state: Secondary | ICD-10-CM | POA: Diagnosis not present

## 2022-08-10 DIAGNOSIS — Z6831 Body mass index (BMI) 31.0-31.9, adult: Secondary | ICD-10-CM | POA: Diagnosis not present

## 2022-08-10 DIAGNOSIS — E7800 Pure hypercholesterolemia, unspecified: Secondary | ICD-10-CM | POA: Diagnosis not present

## 2023-01-03 DIAGNOSIS — D1801 Hemangioma of skin and subcutaneous tissue: Secondary | ICD-10-CM | POA: Diagnosis not present

## 2023-01-03 DIAGNOSIS — Z85820 Personal history of malignant melanoma of skin: Secondary | ICD-10-CM | POA: Diagnosis not present

## 2023-01-03 DIAGNOSIS — L720 Epidermal cyst: Secondary | ICD-10-CM | POA: Diagnosis not present

## 2023-01-03 DIAGNOSIS — D2261 Melanocytic nevi of right upper limb, including shoulder: Secondary | ICD-10-CM | POA: Diagnosis not present

## 2023-01-03 DIAGNOSIS — D224 Melanocytic nevi of scalp and neck: Secondary | ICD-10-CM | POA: Diagnosis not present

## 2023-01-03 DIAGNOSIS — L821 Other seborrheic keratosis: Secondary | ICD-10-CM | POA: Diagnosis not present

## 2023-01-03 DIAGNOSIS — Z85828 Personal history of other malignant neoplasm of skin: Secondary | ICD-10-CM | POA: Diagnosis not present

## 2023-01-03 DIAGNOSIS — L918 Other hypertrophic disorders of the skin: Secondary | ICD-10-CM | POA: Diagnosis not present

## 2023-01-03 DIAGNOSIS — D2262 Melanocytic nevi of left upper limb, including shoulder: Secondary | ICD-10-CM | POA: Diagnosis not present

## 2023-01-03 DIAGNOSIS — D3612 Benign neoplasm of peripheral nerves and autonomic nervous system, upper limb, including shoulder: Secondary | ICD-10-CM | POA: Diagnosis not present

## 2023-01-03 DIAGNOSIS — D225 Melanocytic nevi of trunk: Secondary | ICD-10-CM | POA: Diagnosis not present

## 2023-08-02 DIAGNOSIS — H04123 Dry eye syndrome of bilateral lacrimal glands: Secondary | ICD-10-CM | POA: Diagnosis not present

## 2023-08-29 DIAGNOSIS — H04123 Dry eye syndrome of bilateral lacrimal glands: Secondary | ICD-10-CM | POA: Diagnosis not present

## 2024-02-27 DIAGNOSIS — Z23 Encounter for immunization: Secondary | ICD-10-CM | POA: Diagnosis not present

## 2024-02-27 DIAGNOSIS — E669 Obesity, unspecified: Secondary | ICD-10-CM | POA: Diagnosis not present

## 2024-02-27 DIAGNOSIS — Z6831 Body mass index (BMI) 31.0-31.9, adult: Secondary | ICD-10-CM | POA: Diagnosis not present

## 2024-02-27 DIAGNOSIS — E7800 Pure hypercholesterolemia, unspecified: Secondary | ICD-10-CM | POA: Diagnosis not present

## 2024-02-27 DIAGNOSIS — R7303 Prediabetes: Secondary | ICD-10-CM | POA: Diagnosis not present

## 2024-02-27 DIAGNOSIS — Z860100 Personal history of colon polyps, unspecified: Secondary | ICD-10-CM | POA: Diagnosis not present

## 2024-04-02 DIAGNOSIS — D3617 Benign neoplasm of peripheral nerves and autonomic nervous system of trunk, unspecified: Secondary | ICD-10-CM | POA: Diagnosis not present

## 2024-04-02 DIAGNOSIS — Z85828 Personal history of other malignant neoplasm of skin: Secondary | ICD-10-CM | POA: Diagnosis not present

## 2024-04-02 DIAGNOSIS — L821 Other seborrheic keratosis: Secondary | ICD-10-CM | POA: Diagnosis not present

## 2024-04-02 DIAGNOSIS — D1801 Hemangioma of skin and subcutaneous tissue: Secondary | ICD-10-CM | POA: Diagnosis not present

## 2024-04-02 DIAGNOSIS — L820 Inflamed seborrheic keratosis: Secondary | ICD-10-CM | POA: Diagnosis not present

## 2024-04-02 DIAGNOSIS — L905 Scar conditions and fibrosis of skin: Secondary | ICD-10-CM | POA: Diagnosis not present

## 2024-04-02 DIAGNOSIS — D3612 Benign neoplasm of peripheral nerves and autonomic nervous system, upper limb, including shoulder: Secondary | ICD-10-CM | POA: Diagnosis not present

## 2024-04-02 DIAGNOSIS — I8391 Asymptomatic varicose veins of right lower extremity: Secondary | ICD-10-CM | POA: Diagnosis not present

## 2024-04-02 DIAGNOSIS — D225 Melanocytic nevi of trunk: Secondary | ICD-10-CM | POA: Diagnosis not present

## 2024-04-02 DIAGNOSIS — Z85820 Personal history of malignant melanoma of skin: Secondary | ICD-10-CM | POA: Diagnosis not present

## 2024-07-26 ENCOUNTER — Encounter: Payer: Self-pay | Admitting: *Deleted

## 2024-07-26 NOTE — Progress Notes (Signed)
 Kathryn Whitney                                          MRN: [id number]   07/26/2024   The [facility] Quality Team Specialist reviewed this patient medical record for the purposes of chart review for care gap closure. The following were reviewed: chart review for care gap closure-colorectal cancer screening.    [facility] Quality Team
